# Patient Record
Sex: Male | Born: 1990 | Race: White | Hispanic: No | Marital: Single | Smoking: Current every day smoker
Health system: Southern US, Community
[De-identification: ages and names within clinical notes are randomized; demographics above are authoritative.]

---
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
## 2015-03-13 ENCOUNTER — Encounter (HOSPITAL_COMMUNITY): Payer: Self-pay | Admitting: *Deleted

## 2015-03-13 ENCOUNTER — Emergency Department (HOSPITAL_COMMUNITY)
Admission: EM | Admit: 2015-03-13 | Discharge: 2015-03-13 | Disposition: A | Discharge status: Home / Self Care | Payer: BLUE CROSS/BLUE SHIELD | Attending: Emergency Medicine | Admitting: Emergency Medicine

## 2015-03-13 DIAGNOSIS — Z720 Tobacco use: Secondary | ICD-10-CM | POA: Insufficient documentation

## 2015-03-13 DIAGNOSIS — K120 Recurrent oral aphthae: Secondary | ICD-10-CM

## 2015-03-13 DIAGNOSIS — K1379 Other lesions of oral mucosa: Secondary | ICD-10-CM | POA: Diagnosis present

## 2015-03-13 MED ORDER — MAGIC MOUTHWASH W/LIDOCAINE: 5.0000 mL | Freq: Three times a day (TID) | ORAL | Status: AC | PRN

## 2015-03-13 NOTE — ED Notes (Signed)
Sore throat started 5 days ago, says that is better, now has "bumps in my mouth that are sore".

## 2015-03-13 NOTE — ED Notes (Signed)
Pt. Reports sore throat on Saturday. Pt. Reports sores in his mouth since that time but no longer has a sore throat. Pt. Reports sores are painful "making it feel like I'm eating glass". Pt. Denies cough. No distress noted.

## 2015-03-13 NOTE — ED Provider Notes (Signed)
CSN: [id number]     Arrival date & time 03/13/15  1845 History   First MD Initiated Contact with Patient 03/13/15 1931     No chief complaint on file.    (Consider location/radiation/quality/duration/timing/severity/associated sxs/prior Treatment) Patient is a 24 y.o. male presenting with mouth sores. The history is provided by the patient.  Mouth Lesions Location:  Buccal mucosa Quality:  Painful and blistered Pain details:    Quality:  Sharp   Timing:  Constant   Progression:  Worsening Onset quality:  Gradual Duration:  5 days Progression:  Worsening Chronicity:  New Relieved by:  Nothing  Troy Gray is a 24 y.o. male who presents to the ED with mouth pain and ulcers in his mouth. The symptoms started out about 5 days ago with a sore throat. The sore throat went away and then the blister areas started in the mouth.   History reviewed. No pertinent past medical history. History reviewed. No pertinent past surgical history. Family History  Problem Relation Age of Onset  . Diabetes Mother   . Heart failure Mother    History  Substance Use Topics  . Smoking status: Current Every Day Smoker  . Smokeless tobacco: Not on file  . Alcohol Use: Yes    Review of Systems  HENT: Positive for mouth sores.   all other systems negative    Allergies  Tylenol  Home Medications   Prior to Admission medications   Medication Sig Start Date End Date Taking? Authorizing Provider  Alum & Mag Hydroxide-Simeth (MAGIC MOUTHWASH W/LIDOCAINE) SOLN Take 5 mLs by mouth 3 (three) times daily as needed for mouth pain. 03/13/15   Elin Fenley Orlene OchM Jennilyn Esteve, NP   BP 135/83 mmHg  Pulse 95  Temp(Src) 99.6 F (37.6 C) (Oral)  Resp 18  Ht 6' (1.829 m)  Wt 240 lb (108.863 kg)  BMI 32.54 kg/m2  SpO2 98% Physical Exam  Constitutional: He is oriented to person, place, and time. He appears well-developed and well-nourished.  HENT:  Head: Normocephalic and atraumatic.  Nose: Nose normal.   Mouth/Throat: Uvula is midline. Oral lesions present.  Multiple oral vesicular lesions to the mucous membrane of the lower and upper lip and tongue. Tender on exam.   Eyes: Conjunctivae and EOM are normal.  Neck: Normal range of motion. Neck supple.  Cardiovascular: Normal rate and regular rhythm.   Pulmonary/Chest: Effort normal and breath sounds normal.  Musculoskeletal: Normal range of motion.  Lymphadenopathy:    He has no cervical adenopathy.  Neurological: He is alert and oriented to person, place, and time. No cranial nerve deficit.  Skin: Skin is warm and dry.  Psychiatric: He has a normal mood and affect. His behavior is normal.  Nursing note and vitals reviewed.   ED Course  Procedures (including critical care time) Labs Review  MDM  24 y.o. male with oral pain due to ulcers. Will treat with magic mouth wash and he will take tylenol and ibuprofen as needed for pain. He will return as needed for worsening symptoms. Stable for d/c without fever, difficulty swallowing, n/v or other problems. Discussed with the patient clinical findings, diet, pain management and all questioned fully answered.   Final diagnoses:  Oral aphthous ulcer       Janne NapoleonHope M Garrell Flagg, NP 03/13/15 1953  Gilda Creasehristopher J Pollina, MD 03/13/15 [phone]

## 2015-03-13 NOTE — Discharge Instructions (Signed)
Use Advil and tylenol as needed for pain. Use the magic mouth wash to swish in your mouth. You may also apply ora gel. Return as needed for worsening symptoms.

## 2017-01-27 ENCOUNTER — Emergency Department (HOSPITAL_COMMUNITY)
Admission: EM | Admit: 2017-01-27 | Discharge: 2017-01-27 | Disposition: A | Discharge status: Home / Self Care | Payer: Self-pay | Attending: Emergency Medicine | Admitting: Emergency Medicine

## 2017-01-27 ENCOUNTER — Emergency Department (HOSPITAL_COMMUNITY): Payer: Self-pay

## 2017-01-27 ENCOUNTER — Encounter (HOSPITAL_COMMUNITY): Payer: Self-pay | Admitting: Emergency Medicine

## 2017-01-27 DIAGNOSIS — F17200 Nicotine dependence, unspecified, uncomplicated: Secondary | ICD-10-CM | POA: Insufficient documentation

## 2017-01-27 DIAGNOSIS — R109 Unspecified abdominal pain: Secondary | ICD-10-CM | POA: Insufficient documentation

## 2017-01-27 DIAGNOSIS — Z79899 Other long term (current) drug therapy: Secondary | ICD-10-CM | POA: Insufficient documentation

## 2017-01-27 LAB — I-STAT CHEM 8, ED
BUN: 5 mg/dL — ABNORMAL LOW (ref 6–20)
Calcium, Ion: 1.05 mmol/L — ABNORMAL LOW (ref 1.15–1.40)
Chloride: 103 mmol/L (ref 101–111)
Creatinine, Ser: 0.9 mg/dL (ref 0.61–1.24)
Glucose, Bld: 137 mg/dL — ABNORMAL HIGH (ref 65–99)
HCT: 47 % (ref 39.0–52.0)
Hemoglobin: 16 g/dL (ref 13.0–17.0)
Potassium: 3.5 mmol/L (ref 3.5–5.1)
Sodium: 140 mmol/L (ref 135–145)
TCO2: 25 mmol/L (ref 0–100)

## 2017-01-27 LAB — URINALYSIS, ROUTINE W REFLEX MICROSCOPIC
Bilirubin Urine: NEGATIVE
Glucose, UA: NEGATIVE mg/dL
Ketones, ur: NEGATIVE mg/dL
Leukocytes, UA: NEGATIVE
Nitrite: NEGATIVE
Protein, ur: 30 mg/dL — AB
Specific Gravity, Urine: 1.027 (ref 1.005–1.030)
pH: 5 (ref 5.0–8.0)

## 2017-01-27 MED ORDER — IBUPROFEN 800 MG PO TABS: 800.0000 mg | ORAL_TABLET | Freq: Three times a day (TID) | ORAL | 0 refills | Status: AC | PRN

## 2017-01-27 MED ORDER — ONDANSETRON 4 MG PO TBDP
4.0000 mg | ORAL_TABLET | Freq: Once | ORAL | Status: AC
  Administered 2017-01-27: 4 mg via ORAL
  Filled 2017-01-27: qty 1

## 2017-01-27 MED ORDER — KETOROLAC TROMETHAMINE 30 MG/ML IJ SOLN
30.0000 mg | Freq: Once | INTRAMUSCULAR | Status: AC
Start: 2017-01-27 — End: 2017-01-27
  Administered 2017-01-27: 30 mg via INTRAVENOUS
  Filled 2017-01-27: qty 1

## 2017-01-27 NOTE — Discharge Instructions (Signed)

## 2017-01-27 NOTE — ED Triage Notes (Signed)
Patient in from home with complaints of right sided flank pain nonradiating. Nausea vomiting x2 days. No history of stone. No urinary symptoms.

## 2017-01-27 NOTE — ED Provider Notes (Signed)
Emergency Department Provider Note   I have reviewed the triage vital signs and the nursing notes.   HISTORY  Chief Complaint Nausea; Flank Pain; and Emesis   HPI Troy Gray is a 26 y.o. male presents to the emergency department for evaluation of right back and flank discomfort. Symptoms became severe yesterday been intermittent over the last 2 days. No dysuria, hesitancy, or urgency. Pain radiates from the back around to the right inguinal area. No fever or chills. No LE numbness or weakness. No history of similar symptoms. No alleviating factors.    History reviewed. No pertinent past medical history.  There are no active problems to display for this patient.   History reviewed. No pertinent surgical history.  Current Outpatient Rx  . Order #: [id number] Class: Print  . Order #: [id number] Class: Print    Allergies Tylenol [acetaminophen]  Family History  Problem Relation Age of Onset  . Diabetes Mother   . Heart failure Mother     Social History Social History  Substance Use Topics  . Smoking status: Current Every Day Smoker  . Smokeless tobacco: Never Used  . Alcohol use Yes    Review of Systems  Constitutional: No fever/chills Eyes: No visual changes. ENT: No sore throat. Cardiovascular: Denies chest pain. Respiratory: Denies shortness of breath. Gastrointestinal: No abdominal pain.  No nausea, no vomiting.  No diarrhea.  No constipation. Genitourinary: Negative for dysuria. Musculoskeletal: Positive right flank pain.  Skin: Negative for rash. Neurological: Negative for headaches, focal weakness or numbness.  10-point ROS otherwise negative.  ____________________________________________   PHYSICAL EXAM:  VITAL SIGNS: ED Triage Vitals  Enc Vitals Group     BP 01/27/17 1216 (!) 140/92     Pulse Rate 01/27/17 1216 98     Resp 01/27/17 1216 16     Temp 01/27/17 1216 98.3 F (36.8 C)     Temp Source 01/27/17 1216 Oral     SpO2  01/27/17 1216 96 %     Weight 01/27/17 1216 250 lb (113.4 kg)     Height 01/27/17 1216 6' (1.829 m)     Pain Score 01/27/17 1219 6   Constitutional: Alert and oriented. Well appearing and in no acute distress. Eyes: Conjunctivae are normal.  Head: Atraumatic. Nose: No congestion/rhinnorhea. Mouth/Throat: Mucous membranes are moist. Neck: No stridor.   Cardiovascular: Normal rate, regular rhythm. Good peripheral circulation. Grossly normal heart sounds.   Respiratory: Normal respiratory effort.  No retractions. Lungs CTAB. Gastrointestinal: Soft and nontender. No distention. No CVA tenderness to percussion.  Musculoskeletal: No lower extremity tenderness nor edema. No gross deformities of extremities. Neurologic:  Normal speech and language. No gross focal neurologic deficits are appreciated.  Skin:  Skin is warm, dry and intact. No rash noted. Psychiatric: Mood and affect are normal. Speech and behavior are normal.  ____________________________________________   LABS (all labs ordered are listed, but only abnormal results are displayed)  Labs Reviewed  URINALYSIS, ROUTINE W REFLEX MICROSCOPIC - Abnormal; Notable for the following:       Result Value   APPearance CLOUDY (*)    Hgb urine dipstick SMALL (*)    Protein, ur 30 (*)    Bacteria, UA MANY (*)    Squamous Epithelial / LPF 0-5 (*)    All other components within normal limits  I-STAT CHEM 8, ED - Abnormal; Notable for the following:    BUN 5 (*)    Glucose, Bld 137 (*)    Calcium, Ion 1.05 (*)  All other components within normal limits   ____________________________________________  RADIOLOGY  Ct Renal Stone Study  Result Date: 01/27/2017 CLINICAL DATA:  Right flank pain, nausea/ vomiting x2 days EXAM: CT ABDOMEN AND PELVIS WITHOUT CONTRAST TECHNIQUE: Multidetector CT imaging of the abdomen and pelvis was performed following the standard protocol without IV contrast. COMPARISON:  None. FINDINGS: Lower chest: 3 mm  subpleural left lower lobe pulmonary nodule (series 8/image 16), likely benign. Dedicated follow-up imaging is not required given the patient age. Hepatobiliary: Mild hepatic steatosis, with focal fatty sparing along the gallbladder fossa. Gallbladder is unremarkable. No intrahepatic or extrahepatic ductal dilatation. Pancreas: Within normal limits. Spleen: Within normal limits. Adrenals/Urinary Tract: Adrenal glands are within normal limits. Kidneys are within normal limits. No renal, ureteral, or bladder calculi. No hydronephrosis. Bladder is within normal limits. Stomach/Bowel: Stomach is within normal limits. No evidence of bowel obstruction. Normal appendix (series 2/ image 66). Vascular/Lymphatic: No evidence of abdominal aortic aneurysm. No suspicious abdominopelvic lymphadenopathy. Reproductive: Prostate is unremarkable. Other: No abdominopelvic ascites. Musculoskeletal: Visualized osseous structures are within normal limits. IMPRESSION: No renal, ureteral, or bladder calculi.  No hydronephrosis. Mild hepatic steatosis. Electronically Signed   By: Charline Bills M.D.   On: 01/27/2017 13:50    ____________________________________________   PROCEDURES  Procedure(s) performed:   Procedures  None ____________________________________________   INITIAL IMPRESSION / ASSESSMENT AND PLAN / ED COURSE  Pertinent labs & imaging results that were available during my care of the patient were reviewed by me and considered in my medical decision making (see chart for details).  Patient presents to the ED for evaluation of right flank pain. History is concerning for ureteral stone but no evidence of this on CT. Patient with a small amount of Hb in UA but no evidence of infection. Normal abdominal exam. No indication for additional imaging. Plan for supportive care and home and PCP follow up PRN for worsening symptoms.   At this time, I do not feel there is any life-threatening condition present. I  have reviewed and discussed all results (EKG, imaging, lab, urine as appropriate), exam findings with patient. I have reviewed nursing notes and appropriate previous records.  I feel the patient is safe to be discharged home without further emergent workup. Discussed usual and customary return precautions. Patient and family (if present) verbalize understanding and are comfortable with this plan.  Patient will follow-up with their primary care provider. If they do not have a primary care provider, information for follow-up has been provided to them. All questions have been answered.  ____________________________________________  FINAL CLINICAL IMPRESSION(S) / ED DIAGNOSES  Final diagnoses:  Right flank pain     MEDICATIONS GIVEN DURING THIS VISIT:  Medications  ketorolac (TORADOL) 30 MG/ML injection 30 mg (30 mg Intravenous Given 01/27/17 1400)  ondansetron (ZOFRAN-ODT) disintegrating tablet 4 mg (4 mg Oral Given 01/27/17 1359)     NEW OUTPATIENT MEDICATIONS STARTED DURING THIS VISIT:  Discharge Medication List as of 01/27/2017  2:56 PM    START taking these medications   Details  ibuprofen (ADVIL,MOTRIN) 800 MG tablet Take 1 tablet (800 mg total) by mouth every 8 (eight) hours as needed., Starting Thu 01/27/2017, Print          Note:  This document was prepared using Dragon voice recognition software and may include unintentional dictation errors.  Alona Bene, MD Emergency Medicine   Dyke Weible, Arlyss Repress, MD 01/28/17 [phone]

## 2017-01-31 ENCOUNTER — Emergency Department (HOSPITAL_COMMUNITY)
Admission: EM | Admit: 2017-01-31 | Discharge: 2017-01-31 | Disposition: A | Discharge status: Home / Self Care | Payer: Self-pay | Attending: Emergency Medicine | Admitting: Emergency Medicine

## 2017-01-31 ENCOUNTER — Encounter (HOSPITAL_COMMUNITY): Payer: Self-pay | Admitting: Emergency Medicine

## 2017-01-31 ENCOUNTER — Emergency Department (HOSPITAL_COMMUNITY)
Admission: EM | Admit: 2017-01-31 | Discharge: 2017-01-31 | Disposition: A | Discharge status: Home / Self Care | Payer: BLUE CROSS/BLUE SHIELD | Attending: Dermatology | Admitting: Dermatology

## 2017-01-31 ENCOUNTER — Emergency Department (HOSPITAL_COMMUNITY): Payer: Self-pay

## 2017-01-31 DIAGNOSIS — Y999 Unspecified external cause status: Secondary | ICD-10-CM | POA: Insufficient documentation

## 2017-01-31 DIAGNOSIS — F17200 Nicotine dependence, unspecified, uncomplicated: Secondary | ICD-10-CM | POA: Insufficient documentation

## 2017-01-31 DIAGNOSIS — Z79899 Other long term (current) drug therapy: Secondary | ICD-10-CM | POA: Insufficient documentation

## 2017-01-31 DIAGNOSIS — X58XXXA Exposure to other specified factors, initial encounter: Secondary | ICD-10-CM | POA: Insufficient documentation

## 2017-01-31 DIAGNOSIS — Y929 Unspecified place or not applicable: Secondary | ICD-10-CM | POA: Insufficient documentation

## 2017-01-31 DIAGNOSIS — R109 Unspecified abdominal pain: Secondary | ICD-10-CM | POA: Insufficient documentation

## 2017-01-31 DIAGNOSIS — Y939 Activity, unspecified: Secondary | ICD-10-CM | POA: Insufficient documentation

## 2017-01-31 DIAGNOSIS — Z5321 Procedure and treatment not carried out due to patient leaving prior to being seen by health care provider: Secondary | ICD-10-CM | POA: Insufficient documentation

## 2017-01-31 DIAGNOSIS — S39012A Strain of muscle, fascia and tendon of lower back, initial encounter: Secondary | ICD-10-CM | POA: Insufficient documentation

## 2017-01-31 LAB — COMPREHENSIVE METABOLIC PANEL
ALBUMIN: 3.5 g/dL (ref 3.5–5.0)
ALK PHOS: 30 U/L — AB (ref 38–126)
ALT: 77 U/L — ABNORMAL HIGH (ref 17–63)
ANION GAP: 10 (ref 5–15)
AST: 41 U/L (ref 15–41)
BILIRUBIN TOTAL: 0.5 mg/dL (ref 0.3–1.2)
BUN: 12 mg/dL (ref 6–20)
CALCIUM: 8.9 mg/dL (ref 8.9–10.3)
CO2: 23 mmol/L (ref 22–32)
Chloride: 106 mmol/L (ref 101–111)
Creatinine, Ser: 1.06 mg/dL (ref 0.61–1.24)
GFR calc Af Amer: >60 mL/min (ref >60)
GFR calc non Af Amer: >60 mL/min (ref >60)
Glucose, Bld: 92 mg/dL (ref 65–99)
POTASSIUM: 3.8 mmol/L (ref 3.5–5.1)
SODIUM: 139 mmol/L (ref 135–145)
TOTAL PROTEIN: 7.1 g/dL (ref 6.5–8.1)

## 2017-01-31 LAB — URINALYSIS, ROUTINE W REFLEX MICROSCOPIC
BILIRUBIN URINE: NEGATIVE
Glucose, UA: NEGATIVE mg/dL
HGB URINE DIPSTICK: NEGATIVE
KETONES UR: NEGATIVE mg/dL
LEUKOCYTES UA: NEGATIVE
NITRITE: NEGATIVE
Protein, ur: NEGATIVE mg/dL
SPECIFIC GRAVITY, URINE: 1.019 (ref 1.005–1.030)
pH: 5 (ref 5.0–8.0)

## 2017-01-31 LAB — CBC WITH DIFFERENTIAL/PLATELET
BASOS PCT: 0 %
Basophils Absolute: 0 10*3/uL (ref 0.0–0.1)
EOS ABS: 0.2 10*3/uL (ref 0.0–0.7)
EOS PCT: 2 %
HCT: 38 % — ABNORMAL LOW (ref 39.0–52.0)
HEMOGLOBIN: 13.4 g/dL (ref 13.0–17.0)
LYMPHS ABS: 2 10*3/uL (ref 0.7–4.0)
Lymphocytes Relative: 22 %
MCH: 29.6 pg (ref 26.0–34.0)
MCHC: 35.3 g/dL (ref 30.0–36.0)
MCV: 83.9 fL (ref 78.0–100.0)
MONO ABS: 1.4 10*3/uL — AB (ref 0.1–1.0)
MONOS PCT: 15 %
NEUTROS PCT: 61 %
Neutro Abs: 5.5 10*3/uL (ref 1.7–7.7)
Platelets: 268 10*3/uL (ref 150–400)
RBC: 4.53 MIL/uL (ref 4.22–5.81)
RDW: 12.2 % (ref 11.5–15.5)
WBC: 9.1 10*3/uL (ref 4.0–10.5)

## 2017-01-31 LAB — LIPASE, BLOOD: Lipase: 27 U/L (ref 11–51)

## 2017-01-31 MED ORDER — SODIUM CHLORIDE 0.9 % IV BOLUS (SEPSIS)
1000.0000 mL | Freq: Once | INTRAVENOUS | Status: AC
  Administered 2017-01-31: 1000 mL via INTRAVENOUS

## 2017-01-31 MED ORDER — CYCLOBENZAPRINE HCL 5 MG PO TABS: 5.0000 mg | ORAL_TABLET | Freq: Three times a day (TID) | ORAL | 0 refills | Status: AC | PRN

## 2017-01-31 NOTE — ED Notes (Signed)
This Clinical research associatewriter called pt from lobby; no response. Did not see pt in lobby.

## 2017-01-31 NOTE — ED Triage Notes (Signed)
Patient complaining of right flank pain x 6 days. [facility] "but they couldn't find anything so I want another opinion." Denies dysuria.

## 2017-01-31 NOTE — ED Notes (Addendum)
This writer called pt from lobby; no response. Did not see pt in lobby.  

## 2017-01-31 NOTE — ED Provider Notes (Signed)
[facility]-EMERGENCY DEPT Provider Note   CSN: [id number] Arrival date & time: 01/31/17  1602     History   Chief Complaint Chief Complaint  Patient presents with  . Flank Pain    HPI Troy SchwalbeChristopher J Gray is a 26 y.o. male here presenting with persistent flank pain, back pain. Patient was seen in the ED 5 days ago and had a CT renal stone study that was unremarkable and unremarkable labs and urinalysis. Patient was thought to have back strain and was sent home with ibuprofen. Patient states that he has intermittent back pain and right flank pain radiating to the right groin since the last several days. Patient denies testicular pain or injury. Patient states that it is worse when he lays down at night and when he walks around. Denies any dysuria or hematuria. Denies any fever or vomiting.    The history is provided by the patient.    History reviewed. No pertinent past medical history.  There are no active problems to display for this patient.   History reviewed. No pertinent surgical history.     Home Medications    Prior to Admission medications   Medication Sig Start Date End Date Taking? Authorizing Provider  ibuprofen (ADVIL,MOTRIN) 800 MG tablet Take 1 tablet (800 mg total) by mouth every 8 (eight) hours as needed. 01/27/17  Yes Long, Troy RepressJoshua G, MD  Alum & Mag Hydroxide-Simeth (MAGIC MOUTHWASH W/LIDOCAINE) SOLN Take 5 mLs by mouth 3 (three) times daily as needed for mouth pain. Patient not taking: Reported on 01/27/2017 03/13/15   Troy Gray, Troy M, NP    Family History Family History  Problem Relation Age of Onset  . Diabetes Mother   . Heart failure Mother     Social History Social History  Substance Use Topics  . Smoking status: Current Every Day Smoker  . Smokeless tobacco: Never Used  . Alcohol use Yes     Comment: occasionally     Allergies   Tylenol [acetaminophen]   Review of Systems Review of Systems  Genitourinary: Positive for flank pain.    Musculoskeletal: Positive for back pain.  All other systems reviewed and are negative.    Physical Exam Updated Vital Signs BP 129/65 (BP Location: Left Arm)   Pulse 82   Temp 98.4 F (36.9 C) (Oral)   Resp 18   SpO2 99%   Physical Exam  Constitutional: He appears well-developed and well-nourished.  Slightly anxious   HENT:  Head: Normocephalic.  Mouth/Throat: Oropharynx is clear and moist.  Eyes: EOM are normal. Pupils are equal, round, and reactive to light.  Neck: Normal range of motion. Neck supple.  Cardiovascular: Normal rate, regular rhythm and normal heart sounds.   Pulmonary/Chest: Effort normal and breath sounds normal. No respiratory distress. He has no wheezes.  Abdominal: Soft. Bowel sounds are normal. He exhibits no distension. There is no tenderness.  Musculoskeletal:  Mild R paralumbar tenderness vs CVAT   Neurological: He is alert.  Skin: Skin is warm.  Psychiatric: He has a normal mood and affect.  Nursing note and vitals reviewed.    ED Treatments / Results  Labs (all labs ordered are listed, but only abnormal results are displayed) Labs Reviewed  CBC WITH DIFFERENTIAL/PLATELET - Abnormal; Notable for the following:       Result Value   HCT 38.0 (*)    Monocytes Absolute 1.4 (*)    All other components within normal limits  COMPREHENSIVE METABOLIC PANEL - Abnormal; Notable for the following:  ALT 77 (*)    Alkaline Phosphatase 30 (*)    All other components within normal limits  URINALYSIS, ROUTINE W REFLEX MICROSCOPIC - Abnormal; Notable for the following:    APPearance HAZY (*)    All other components within normal limits  LIPASE, BLOOD    EKG  EKG Interpretation None       Radiology Dg Lumbar Spine Complete  Result Date: 01/31/2017 CLINICAL DATA:  Unresolved low back pain for 5 days without injury RIGHT greater than LEFT EXAM: LUMBAR SPINE - COMPLETE 4+ VIEW COMPARISON:  None; correlation CT abdomen and pelvis 01/27/2017  FINDINGS: 5 non-rib-bearing lumbar vertebra. Osseous mineralization grossly normal for technique. Minimal anterior height loss at L1 without acute fracture unchanged, question developmental versus old. Vertebral body heights otherwise maintained without fracture or subluxation. No bone destruction or spondylolysis. Small Schmorl's node at inferior endplate of T12. SI joints preserved. IMPRESSION: No acute osseous abnormalities. Electronically Signed   By: Ulyses Southward Gray.D.   On: 01/31/2017 17:47    Procedures Procedures (including critical care time)  Medications Ordered in ED Medications  sodium chloride 0.9 % bolus 1,000 mL (0 mLs Intravenous Stopped 01/31/17 1753)     Initial Impression / Assessment and Plan / ED Course  I have reviewed the triage vital signs and the nursing notes.  Pertinent labs & imaging results that were available during my care of the patient were reviewed by me and considered in my medical decision making (see chart for details).     Troy Gray is a 26 y.o. male here with R paralumbar vs CVAT. Will repeat UA, labs, lumbar xray. Neurovascular intact lower extremities.   6:13 PM Chemistry unremarkable. ALT 77 but AST nl. UA showed no blood or UTI. xrays unremarkable. Will dc home with flexeril. Recommend continue motrin.   Final Clinical Impressions(s) / ED Diagnoses   Final diagnoses:  None    New Prescriptions New Prescriptions   No medications on file     Charlynne Pander, MD 01/31/17 [phone]

## 2017-01-31 NOTE — Discharge Instructions (Signed)
Continue taking ibuprofen for pain.   Take flexeril for muscle spasms.   See your doctor.   Return to ER if you have worse back pain, flank pain, vomiting, fevers, blood in urine.

## 2017-01-31 NOTE — ED Notes (Signed)
Bed: WA06 Expected date:  Expected time:  Means of arrival:  Comments: LOBBY [facility] 26 yo M

## 2017-01-31 NOTE — ED Triage Notes (Addendum)
Pt verbalizes continued right flank pain since Wednesday; seen and evaluated on Thursday; negative exam. Pt denies GU symptoms.

## 2017-01-31 NOTE — ED Notes (Signed)
Pt decided not to be seen

## 2017-01-31 NOTE — ED Notes (Signed)
Troy Gray NT called pt from lobby; no response.

## 2017-01-31 NOTE — ED Triage Notes (Addendum)
[facility] and just checked self back in; pt complaint of continued right flank pain without n/v/d or GU symptoms. Onset of symptoms Wednesday; seen on Thursday with negative exam.

## 2018-02-18 ENCOUNTER — Emergency Department (HOSPITAL_COMMUNITY)
Admission: EM | Admit: 2018-02-18 | Discharge: 2018-02-19 | Disposition: A | Discharge status: Home / Self Care | Payer: No Typology Code available for payment source | Attending: Emergency Medicine | Admitting: Emergency Medicine

## 2018-02-18 ENCOUNTER — Encounter (HOSPITAL_COMMUNITY): Payer: Self-pay | Admitting: Emergency Medicine

## 2018-02-18 DIAGNOSIS — S61411A Laceration without foreign body of right hand, initial encounter: Secondary | ICD-10-CM

## 2018-02-18 DIAGNOSIS — W268XXA Contact with other sharp object(s), not elsewhere classified, initial encounter: Secondary | ICD-10-CM | POA: Diagnosis not present

## 2018-02-18 DIAGNOSIS — F17200 Nicotine dependence, unspecified, uncomplicated: Secondary | ICD-10-CM | POA: Insufficient documentation

## 2018-02-18 DIAGNOSIS — Z23 Encounter for immunization: Secondary | ICD-10-CM | POA: Diagnosis not present

## 2018-02-18 DIAGNOSIS — Y929 Unspecified place or not applicable: Secondary | ICD-10-CM | POA: Insufficient documentation

## 2018-02-18 DIAGNOSIS — Y93H9 Activity, other involving exterior property and land maintenance, building and construction: Secondary | ICD-10-CM | POA: Diagnosis not present

## 2018-02-18 DIAGNOSIS — Y998 Other external cause status: Secondary | ICD-10-CM | POA: Diagnosis not present

## 2018-02-18 MED ORDER — TETANUS-DIPHTH-ACELL PERTUSSIS 5-2.5-18.5 LF-MCG/0.5 IM SUSP
0.5000 mL | Freq: Once | INTRAMUSCULAR | Status: AC
  Administered 2018-02-18: 0.5 mL via INTRAMUSCULAR
  Filled 2018-02-18: qty 0.5

## 2018-02-18 MED ORDER — LIDOCAINE HCL (PF) 1 % IJ SOLN
5.0000 mL | Freq: Once | INTRAMUSCULAR | Status: AC
  Administered 2018-02-18: 5 mL via INTRADERMAL
  Filled 2018-02-18: qty 5

## 2018-02-18 NOTE — ED Triage Notes (Signed)
Pt reports laceration to R hand at work. Bleeding controlled, TDAP unknown.

## 2018-02-18 NOTE — ED Provider Notes (Signed)
[facility] Provider [facility]   CSN: [id number] Arrival date & time: 02/18/18  2040     History   Chief Complaint Chief Complaint  Patient presents with  . Extremity Laceration    HPI Troy Gray is a 27 y.o. male.  Who presents to the emergency department for evaluation of laceration to the right hand patient was loading a truck at work and got his hand caught on a latch.  He complains of pain over the right fifth MCP joint.  He has a small elliptical laceration.  No numbness or tingling. HPI  History reviewed. No pertinent past medical history.  There are no active problems to display for this patient.   History reviewed. No pertinent surgical history.      Home Medications    Prior to Admission medications   Medication Sig Start Date End Date Taking? Authorizing Provider  Alum & Mag Hydroxide-Simeth (MAGIC MOUTHWASH W/LIDOCAINE) SOLN Take 5 mLs by mouth 3 (three) times daily as needed for mouth pain. Patient not taking: Reported on 01/27/2017 03/13/15   Janne NapoleonNeese, Hope M, NP  cyclobenzaprine (FLEXERIL) 5 MG tablet Take 1 tablet (5 mg total) by mouth 3 (three) times daily as needed for muscle spasms. 01/31/17   Charlynne PanderYao, David Hsienta, MD  ibuprofen (ADVIL,MOTRIN) 800 MG tablet Take 1 tablet (800 mg total) by mouth every 8 (eight) hours as needed. 01/27/17   Long, Arlyss RepressJoshua G, MD    Family History Family History  Problem Relation Age of Onset  . Diabetes Mother   . Heart failure Mother     Social History Social History   Tobacco Use  . Smoking status: Current Every Day Smoker  . Smokeless tobacco: Never Used  Substance Use Topics  . Alcohol use: Yes    Comment: occasionally  . Drug use: No     Allergies   Tylenol [acetaminophen]   Review of Systems Review of Systems  Skin: Positive for wound.  Neurological: Negative for weakness and numbness.     Physical Exam Updated Vital Signs BP (!) 131/93 (BP Location: Right  Arm)   Pulse 89   Temp 98.5 F (36.9 C) (Oral)   Resp 16   Ht 6\' 1"  (1.854 m)   Wt 117.9 kg (260 lb)   SpO2 98%   BMI 34.30 kg/m   Physical Exam  Constitutional: He appears well-developed and well-nourished. No distress.  HENT:  Head: Normocephalic and atraumatic.  Eyes: Conjunctivae are normal. No scleral icterus.  Neck: Normal range of motion. Neck supple.  Cardiovascular: Normal rate, regular rhythm and normal heart sounds.  Pulmonary/Chest: Effort normal and breath sounds normal. No respiratory distress.  Abdominal: Soft. There is no tenderness.  Musculoskeletal: He exhibits no edema.  Full range of motion including flexion extension at each joint of the pinky, normal abduction and abduction with appropriate strength in the right hand.  Normal ipsilateral wrist examination.  Neurological: He is alert.  Skin: Skin is warm and dry. He is not diaphoretic.  Psychiatric: His behavior is normal.  Nursing note and vitals reviewed.    ED Treatments / Results  Labs (all labs ordered are listed, but only abnormal results are displayed) Labs Reviewed - No data to display  EKG None  Radiology No results found.  Procedures Procedures (including critical care time)  Medications Ordered in ED Medications - No data to display  LACERATION REPAIR Performed by: Arthor CaptainAbigail Riaan Toledo Authorized by: Arthor CaptainAbigail Baylea Milburn Consent: Verbal consent obtained. Risks and  benefits: risks, benefits and alternatives were discussed Consent given by: patient Patient identity confirmed: provided demographic data Prepped and Draped in normal sterile fashion Wound explored  Laceration Location: Right fifth MCP  Laceration Length: 1 cm  No Foreign Bodies seen or palpated  Anesthesia: local infiltration  Local anesthetic: lidocaine 1 % without epinephrine  Anesthetic total: 3 ml  Irrigation method: syringe Amount of cleaning: standard  Skin closure: Five-point 0 chromic gut  Number of  sutures: 3  Technique: Simple interrupted  Patient tolerance: Patient tolerated the procedure well with no immediate complications.  Initial Impression / Assessment and Plan / ED Course  I have reviewed the triage vital signs and the nursing notes.  Pertinent labs & imaging results that were available during my care of the patient were reviewed by me and considered in my medical decision making (see chart for details).  Clinical Course as of Feb 20 11  Wynelle Link Feb 19, 2018  0012 After anesthetizing the wound patient was still having exquisite tenderness over the fifth MCP joint.  Because of this I decided to get an x-ray to make sure there was not an open fracture.   [AH]    Clinical Course User Index [AH] Arthor Captain, PA-C    Troy Gray is a 27 y.o. male who presents to ED for laceration of Hand. Wound thoroughly cleaned in ED today. Wound explored and bottom of wound seen in a bloodless field. Laceration repaired as dictated above. Patient counseled on home wound care. . Patient was urged to return to the Emergency Department for worsening pain, swelling, expanding erythema especially if it streaks away from the affected area, fever, or for any additional concerns. Patient verbalized understanding. All questions answered.   Final Clinical Impressions(s) / ED Diagnoses   Final diagnoses:  Laceration of right hand without foreign body, initial encounter    ED Discharge Orders    None       Arthor Captain, PA-C 02/19/18 2142    Shon Baton, MD 02/20/18 0600

## 2018-02-19 ENCOUNTER — Emergency Department (HOSPITAL_COMMUNITY): Payer: No Typology Code available for payment source

## 2018-02-19 MED ORDER — MELOXICAM 15 MG PO TABS: 15.0000 mg | ORAL_TABLET | Freq: Every day | ORAL | 0 refills | Status: AC

## 2018-02-19 MED ORDER — CEPHALEXIN 500 MG PO CAPS: ORAL_CAPSULE | ORAL | 0 refills | Status: AC

## 2018-02-19 NOTE — Discharge Instructions (Addendum)
Stitches are dissolvable and should be present for 10 to 14 days.  Have them evaluated if you have any signs of infection or they are not dissolving appropriately.  Follow-up with Dr. Roda ShuttersXu if you are having any worsening in your symptoms or you may follow-up with Dr. Janee Mornhompson who is on-call for orthopedic hand surgery today.  Take your medication as prescribed.

## 2019-03-16 IMAGING — DX DG HAND COMPLETE 3+V*R*
3 series · 3 of 3 positions shown · non-contrast
Comparison: None.

CLINICAL DATA: Laceration between fourth and fifth fingers.

EXAM:
RIGHT HAND - COMPLETE 3+ VIEW

[hand pa]
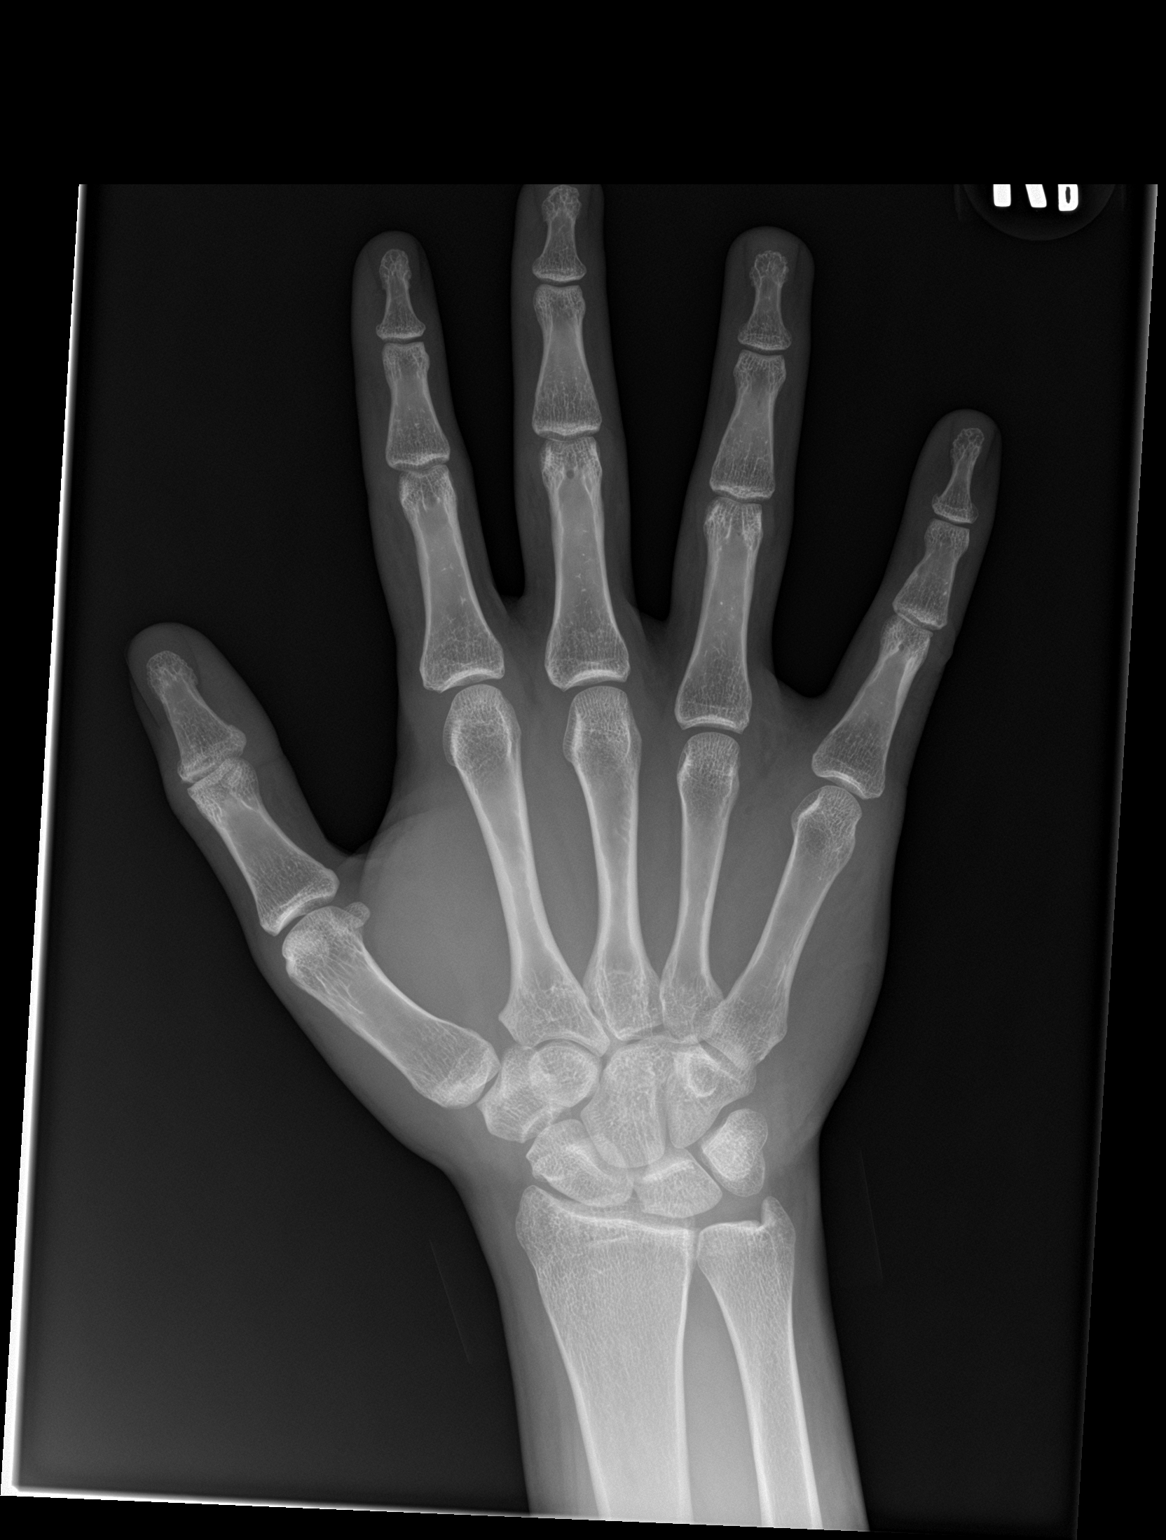

[hand obl]
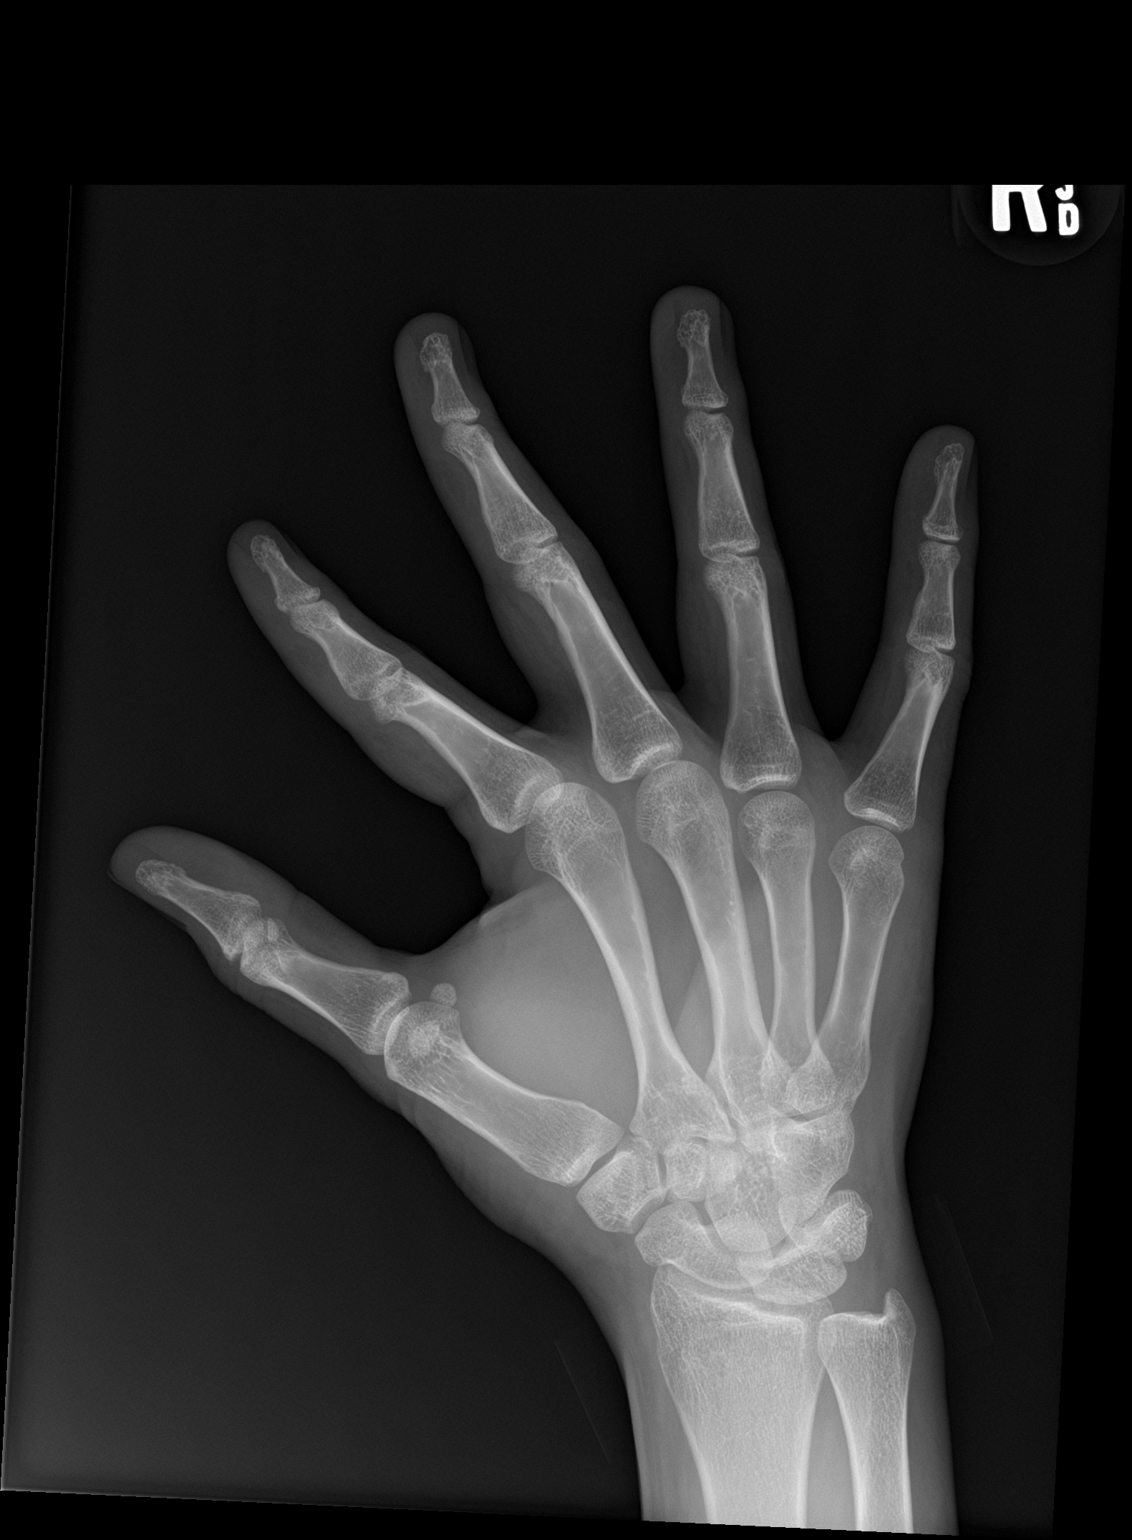

[hand lat]
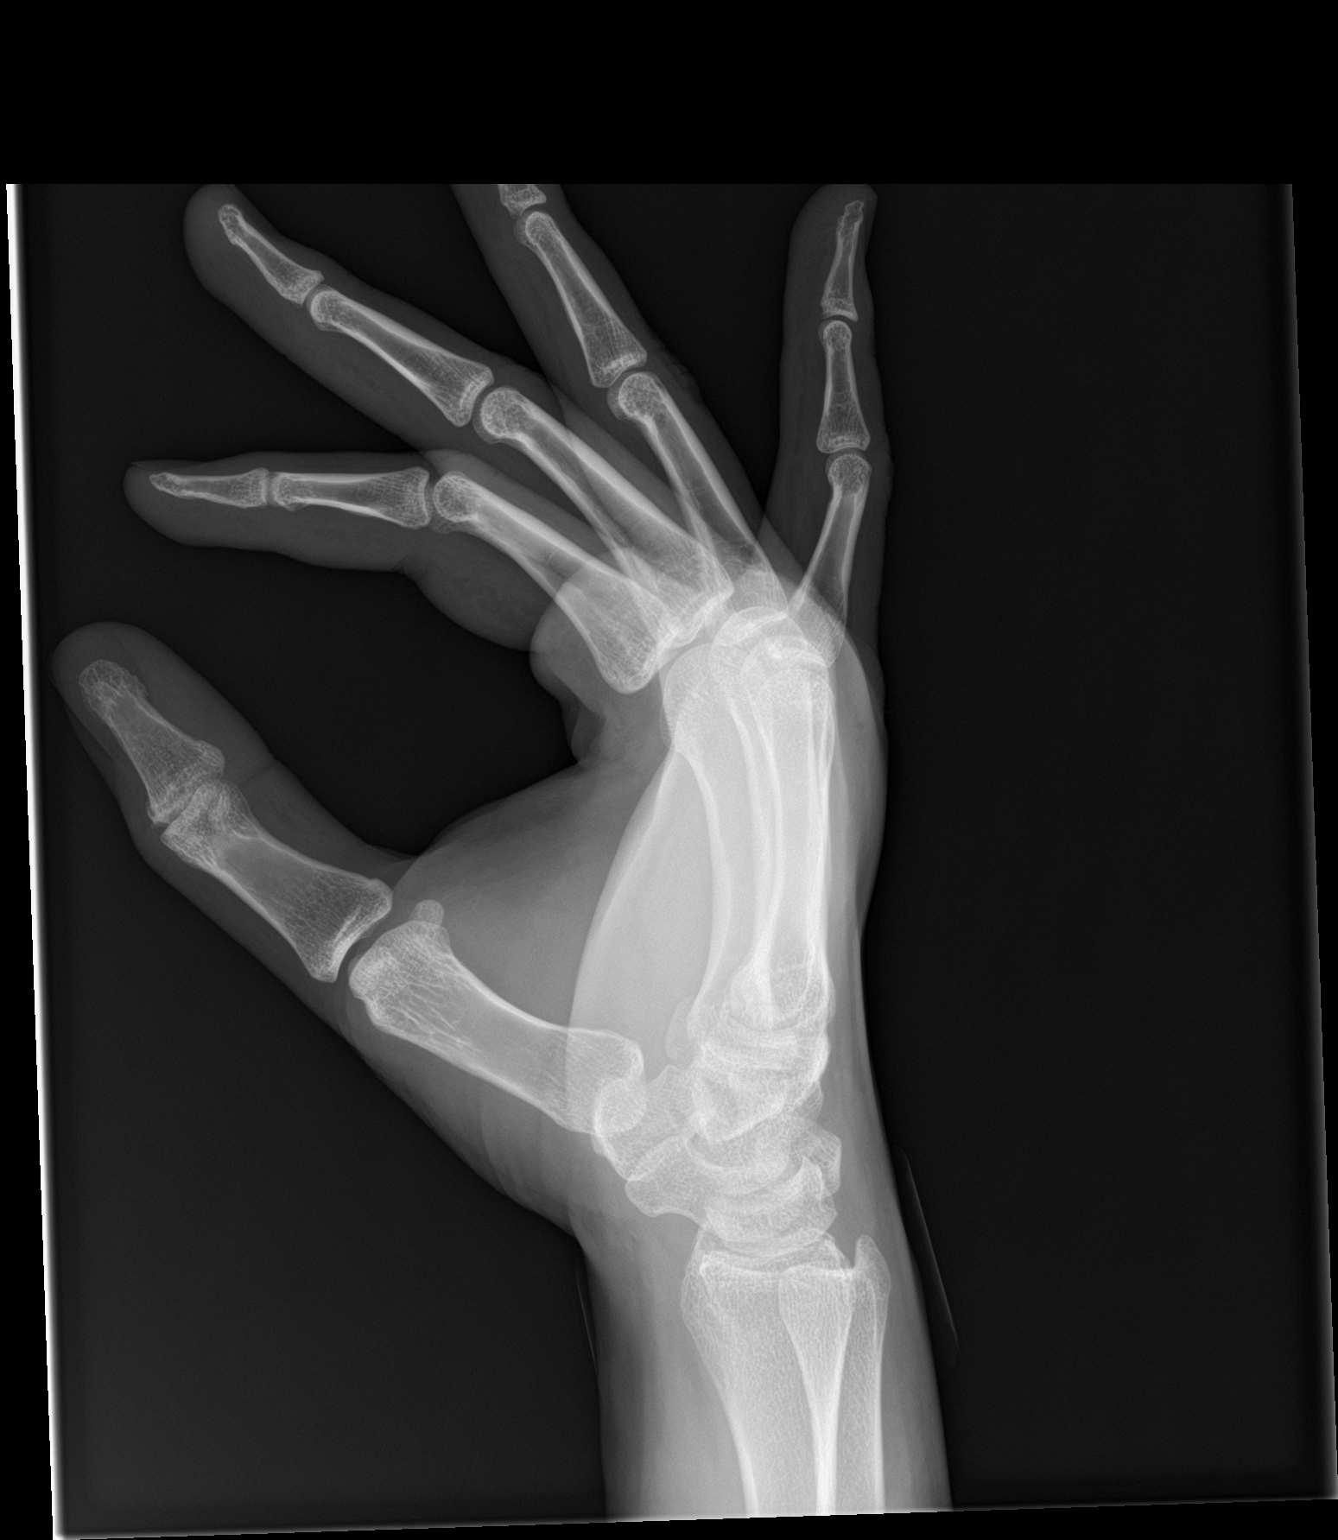

[3 of 3 positions shown; findings below may reference images not displayed]

FINDINGS: There is no evidence of fracture or dislocation. There is no
evidence of arthropathy or other focal bone abnormality. Soft
tissues are unremarkable.
IMPRESSION: Negative.

## 2024-07-05 ENCOUNTER — Ambulatory Visit
Admission: EM | Admit: 2024-07-05 | Discharge: 2024-07-05 | Disposition: A | Discharge status: Home / Self Care | Attending: Family Medicine | Admitting: Family Medicine

## 2024-07-05 DIAGNOSIS — J101 Influenza due to other identified influenza virus with other respiratory manifestations: Secondary | ICD-10-CM

## 2024-07-05 LAB — POC COVID19/FLU A&B COMBO
Covid Antigen, POC: NEGATIVE
Influenza A Antigen, POC: POSITIVE — AB
Influenza B Antigen, POC: NEGATIVE

## 2024-07-05 MED ORDER — OSELTAMIVIR PHOSPHATE 75 MG PO CAPS: 75.0000 mg | ORAL_CAPSULE | Freq: Two times a day (BID) | ORAL | 0 refills | Status: AC

## 2024-07-05 NOTE — ED Provider Notes (Signed)
 [facility]    CSN: [id number] Arrival date & time: 07/05/24  1739      History   Chief Complaint Chief Complaint  Patient presents with   Cough    HPI Troy Gray is a 33 y.o. male.   Patient presenting today with 3-day history of fever, cough, congestion, body aches, fatigue.  Denies chest pain, shortness of breath, vomiting, diarrhea, rashes.  So far taking DayQuil and vitamin C with minimal relief.  No known history of chronic pulmonary disease.    History reviewed. No pertinent past medical history.  There are no active problems to display for this patient.   History reviewed. No pertinent surgical history.     Home Medications    Prior to Admission medications   Medication Sig Start Date End Date Taking? Authorizing Provider  oseltamivir (TAMIFLU) 75 MG capsule Take 1 capsule (75 mg total) by mouth every 12 (twelve) hours. 07/05/24  Yes Stuart Vernell Norris, PA-C  Alum & Mag Hydroxide-Simeth (MAGIC MOUTHWASH W/LIDOCAINE ) SOLN Take 5 mLs by mouth 3 (three) times daily as needed for mouth pain. Patient not taking: Reported on 01/27/2017 03/13/15   Jamelle Lorrayne HERO, NP  cephALEXin  (KEFLEX ) 500 MG capsule 2 caps po bid x 7 days 02/19/18   Harris, Abigail, PA-C  cyclobenzaprine  (FLEXERIL ) 5 MG tablet Take 1 tablet (5 mg total) by mouth 3 (three) times daily as needed for muscle spasms. 01/31/17   Patt Alm Macho, MD  ibuprofen  (ADVIL ,MOTRIN ) 800 MG tablet Take 1 tablet (800 mg total) by mouth every 8 (eight) hours as needed. 01/27/17   Long, Fonda MATSU, MD  meloxicam  (MOBIC ) 15 MG tablet Take 1 tablet (15 mg total) by mouth daily. Take 1 daily with food. 02/19/18   Arloa Chroman, PA-C    Family History Family History  Problem Relation Age of Onset   Diabetes Mother    Heart failure Mother     Social History Social History   Tobacco Use   Smoking status: Every Day   Smokeless tobacco: Never  Substance Use Topics   Alcohol use: Yes     Comment: occasionally   Drug use: No     Allergies   Tylenol [acetaminophen]   Review of Systems Review of Systems Per HPI  Physical Exam Triage Vital Signs ED Triage Vitals  Encounter Vitals Group     BP 07/05/24 1826 126/83     Girls Systolic BP Percentile --      Girls Diastolic BP Percentile --      Boys Systolic BP Percentile --      Boys Diastolic BP Percentile --      Pulse Rate 07/05/24 1826 (!) 105     Resp 07/05/24 1826 16     Temp 07/05/24 1826 98.4 F (36.9 C)     Temp Source 07/05/24 1826 Oral     SpO2 07/05/24 1826 95 %     Weight --      Height --      Head Circumference --      Peak Flow --      Pain Score 07/05/24 1824 0     Pain Loc --      Pain Education --      Exclude from Growth Chart --    No data found.  Updated Vital Signs BP 126/83 (BP Location: Right Arm)   Pulse (!) 105   Temp 98.4 F (36.9 C) (Oral)   Resp 16   SpO2 95%  Visual Acuity Right Eye Distance:   Left Eye Distance:   Bilateral Distance:    Right Eye Near:   Left Eye Near:    Bilateral Near:     Physical Exam Vitals and nursing note reviewed.  Constitutional:      Appearance: He is well-developed.  HENT:     Head: Atraumatic.     Right Ear: External ear normal.     Left Ear: External ear normal.     Nose: Rhinorrhea present.     Mouth/Throat:     Pharynx: Posterior oropharyngeal erythema present. No oropharyngeal exudate.  Eyes:     Conjunctiva/sclera: Conjunctivae normal.     Pupils: Pupils are equal, round, and reactive to light.  Cardiovascular:     Rate and Rhythm: Normal rate and regular rhythm.  Pulmonary:     Effort: Pulmonary effort is normal. No respiratory distress.     Breath sounds: No wheezing or rales.  Musculoskeletal:        General: Normal range of motion.     Cervical back: Normal range of motion and neck supple.  Lymphadenopathy:     Cervical: No cervical adenopathy.  Skin:    General: Skin is warm and dry.  Neurological:      Mental Status: He is alert and oriented to person, place, and time.  Psychiatric:        Behavior: Behavior normal.      UC Treatments / Results  Labs (all labs ordered are listed, but only abnormal results are displayed) Labs Reviewed  POC COVID19/FLU A&B COMBO - Abnormal; Notable for the following components:      Result Value   Influenza A Antigen, POC Positive (*)    All other components within normal limits    EKG   Radiology No results found.  Procedures Procedures (including critical care time)  Medications Ordered in UC Medications - No data to display  Initial Impression / Assessment and Plan / UC Course  I have reviewed the triage vital signs and the nursing notes.  Pertinent labs & imaging results that were available during my care of the patient were reviewed by me and considered in my medical decision making (see chart for details).     Rapid flu positive, treat with Tamiflu, supportive over-the-counter medications at home care.  Return given.  Return for worsening symptoms.  Vitals and exam reassuring.  Final Clinical Impressions(s) / UC Diagnoses   Final diagnoses:  Influenza A   Discharge Instructions   None    ED Prescriptions     Medication Sig Dispense Auth. Provider   oseltamivir (TAMIFLU) 75 MG capsule Take 1 capsule (75 mg total) by mouth every 12 (twelve) hours. 10 capsule Stuart Vernell Norris, [place]      PDMP not reviewed this encounter.   Stuart Vernell Norris, [place] 07/05/24 1930

## 2024-07-05 NOTE — ED Triage Notes (Signed)
 Pt states fever,cough,congestion, runny nose for the past 3 days.  States he's been taking Dayquil and Vit C at home.
